# Patient Record
Sex: Male | Born: 1995 | Race: Black or African American | Hispanic: No | Marital: Single | Smoking: Never smoker
Health system: Southern US, Community
[De-identification: ages and names within clinical notes are randomized; demographics above are authoritative.]

---
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
## 2018-10-01 ENCOUNTER — Ambulatory Visit: Payer: Self-pay | Admitting: Medical

## 2019-05-26 ENCOUNTER — Other Ambulatory Visit: Payer: Self-pay

## 2019-05-26 ENCOUNTER — Emergency Department (HOSPITAL_BASED_OUTPATIENT_CLINIC_OR_DEPARTMENT_OTHER): Payer: Worker's Compensation

## 2019-05-26 ENCOUNTER — Encounter (HOSPITAL_BASED_OUTPATIENT_CLINIC_OR_DEPARTMENT_OTHER): Payer: Self-pay

## 2019-05-26 ENCOUNTER — Emergency Department (HOSPITAL_BASED_OUTPATIENT_CLINIC_OR_DEPARTMENT_OTHER)
Admission: EM | Admit: 2019-05-26 | Discharge: 2019-05-27 | Disposition: A | Discharge status: Home / Self Care | Payer: Worker's Compensation | Attending: Emergency Medicine | Admitting: Emergency Medicine

## 2019-05-26 DIAGNOSIS — Y999 Unspecified external cause status: Secondary | ICD-10-CM | POA: Insufficient documentation

## 2019-05-26 DIAGNOSIS — S4991XA Unspecified injury of right shoulder and upper arm, initial encounter: Secondary | ICD-10-CM | POA: Diagnosis present

## 2019-05-26 DIAGNOSIS — W010XXA Fall on same level from slipping, tripping and stumbling without subsequent striking against object, initial encounter: Secondary | ICD-10-CM | POA: Diagnosis not present

## 2019-05-26 DIAGNOSIS — S43011A Anterior subluxation of right humerus, initial encounter: Secondary | ICD-10-CM | POA: Diagnosis not present

## 2019-05-26 DIAGNOSIS — Y939 Activity, unspecified: Secondary | ICD-10-CM | POA: Diagnosis not present

## 2019-05-26 DIAGNOSIS — S43004A Unspecified dislocation of right shoulder joint, initial encounter: Secondary | ICD-10-CM

## 2019-05-26 DIAGNOSIS — Y929 Unspecified place or not applicable: Secondary | ICD-10-CM | POA: Insufficient documentation

## 2019-05-26 MED ORDER — MORPHINE SULFATE (PF) 4 MG/ML IV SOLN
4.0000 mg | Freq: Once | INTRAVENOUS | Status: DC
  Filled 2019-05-26: qty 1

## 2019-05-26 MED ORDER — FENTANYL CITRATE (PF) 100 MCG/2ML IJ SOLN
100.0000 ug | Freq: Once | INTRAMUSCULAR | Status: AC
  Administered 2019-05-26: 100 ug via INTRAVENOUS
  Filled 2019-05-26: qty 2

## 2019-05-26 MED ORDER — METHOCARBAMOL 1000 MG/10ML IJ SOLN
1000.0000 mg | Freq: Once | INTRAMUSCULAR | Status: AC
  Administered 2019-05-26: 1000 mg via INTRAVENOUS
  Filled 2019-05-26: qty 10

## 2019-05-26 MED ORDER — KETOROLAC TROMETHAMINE 30 MG/ML IJ SOLN
15.0000 mg | Freq: Once | INTRAMUSCULAR | Status: AC
  Administered 2019-05-26: 15 mg via INTRAVENOUS
  Filled 2019-05-26: qty 1

## 2019-05-26 MED ORDER — HYDROMORPHONE HCL 1 MG/ML IJ SOLN
1.0000 mg | Freq: Once | INTRAMUSCULAR | Status: AC
  Administered 2019-05-26: 1 mg via INTRAVENOUS
  Filled 2019-05-26: qty 1

## 2019-05-26 NOTE — ED Triage Notes (Signed)
Pt c/o slip/fall ~930pm-pain to right shoulder-pt wearing fabric sling-steady gait/grimacing

## 2019-05-27 ENCOUNTER — Emergency Department (HOSPITAL_BASED_OUTPATIENT_CLINIC_OR_DEPARTMENT_OTHER): Payer: Worker's Compensation

## 2019-05-27 ENCOUNTER — Encounter (HOSPITAL_BASED_OUTPATIENT_CLINIC_OR_DEPARTMENT_OTHER): Payer: Self-pay | Admitting: Emergency Medicine

## 2019-05-27 MED ORDER — DICLOFENAC SODIUM ER 100 MG PO TB24: 100.0000 mg | ORAL_TABLET | Freq: Every day | ORAL | 0 refills | Status: AC

## 2019-05-27 NOTE — ED Provider Notes (Signed)
[facility] EMERGENCY DEPARTMENT Provider Note   CSN: [id number] Arrival date & time: 05/26/19  2151     History   Chief Complaint Chief Complaint  Patient presents with  . Shoulder Injury    HPI John Snyder is a 23 y.o. male.     The history is provided by the patient.  Shoulder Injury This is a new problem. The current episode started 1 to 2 hours ago. The problem occurs constantly. The problem has not changed since onset.Pertinent negatives include no chest pain, no abdominal pain, no headaches and no shortness of breath. Nothing aggravates the symptoms. Nothing relieves the symptoms. He has tried nothing for the symptoms. The treatment provided no relief.  Patient slipped and fell at approximately 930 and hit arm and now right shoulder is deformed.  No LOC.  Did not hit head.  Last meal 3 hours ago.    History reviewed. No pertinent past medical history.  There are no active problems to display for this patient.   History reviewed. No pertinent surgical history.      Home Medications    Prior to Admission medications   Medication Sig Start Date End Date Taking? Authorizing Provider  Diclofenac Sodium CR 100 MG 24 hr tablet Take 1 tablet (100 mg total) by mouth daily. 05/27/19   Anica Alcaraz, MD    Family History No family history on file.  Social History Social History   Tobacco Use  . Smoking status: Never Smoker  . Smokeless tobacco: Never Used  Substance Use Topics  . Alcohol use: Yes    Comment: weekly  . Drug use: Yes    Types: Marijuana     Allergies   Patient has no known allergies.   Review of Systems Review of Systems  Constitutional: Negative for fever.  HENT: Negative for congestion.   Eyes: Negative for visual disturbance.  Respiratory: Negative for cough and shortness of breath.   Cardiovascular: Negative for chest pain.  Gastrointestinal: Negative for abdominal pain.  Genitourinary: Negative for difficulty  urinating.  Musculoskeletal: Positive for arthralgias.  Neurological: Negative for dizziness and headaches.  All other systems reviewed and are negative.    Physical Exam Updated Vital Signs BP 94/67   Pulse 69   Temp 98.5 F (36.9 C) (Oral)   Resp 18   Ht 6\' 2"  (1.88 m)   Wt 90.7 kg   SpO2 100%   BMI 25.68 kg/m   Physical Exam Vitals signs and nursing note reviewed.  Constitutional:      General: He is not in acute distress.    Appearance: He is normal weight.  HENT:     Head: Normocephalic and atraumatic.     Nose: Nose normal.  Eyes:     Conjunctiva/sclera: Conjunctivae normal.     Pupils: Pupils are equal, round, and reactive to light.  Neck:     Musculoskeletal: Normal range of motion and neck supple.  Cardiovascular:     Rate and Rhythm: Normal rate and regular rhythm.     Pulses: Normal pulses.     Heart sounds: Normal heart sounds.  Pulmonary:     Effort: Pulmonary effort is normal.     Breath sounds: Normal breath sounds.  Abdominal:     General: Abdomen is flat. Bowel sounds are normal.     Tenderness: There is no abdominal tenderness.  Musculoskeletal:     Right shoulder: He exhibits decreased range of motion and deformity. He exhibits no effusion.  Right elbow: Normal.    Right wrist: Normal.     Right upper arm: Normal.     Right hand: Normal. He exhibits normal capillary refill. Normal sensation noted. Normal strength noted.  Skin:    General: Skin is warm and dry.     Capillary Refill: Capillary refill takes less than 2 seconds.  Neurological:     General: No focal deficit present.     Mental Status: He is alert and oriented to person, place, and time.  Psychiatric:        Mood and Affect: Mood normal.        Behavior: Behavior normal.      ED Treatments / Results  Labs (all labs ordered are listed, but only abnormal results are displayed) Labs Reviewed - No data to display  EKG None  Radiology Dg Shoulder Right  Result Date:  05/27/2019 CLINICAL DATA:  Status post reduction EXAM: RIGHT SHOULDER - 2+ VIEW COMPARISON:  Film from the previous day. FINDINGS: Humeral head is been reduced into the glenoid. No acute fracture is seen. No soft tissue abnormality is noted. IMPRESSION: Interval reduction of previously seen right shoulder dislocation. Electronically Signed   By: Inez Catalina M.D.   On: 05/27/2019 00:13   Dg Shoulder Right  Result Date: 05/26/2019 CLINICAL DATA:  Pain EXAM: RIGHT SHOULDER - 2+ VIEW COMPARISON:  None. FINDINGS: There is an anterior inferior glenohumeral dislocation without evidence for displaced fracture. The osseous mineralization is within normal limits. IMPRESSION: Anterior inferior glenohumeral dislocation without evidence for displaced fracture. Post reduction radiographs are recommended. Electronically Signed   By: Constance Holster M.D.   On: 05/26/2019 22:28    Procedures .Ortho Injury Treatment  Date/Time: 05/27/2019 3:18 AM Performed by: Veatrice Kells, MD Authorized by: Veatrice Kells, MD   Consent:    Consent obtained:  Verbal   Consent given by:  Patient   Risks discussed:  Fracture, irreducible dislocation, recurrent dislocation, nerve damage, restricted joint movement and stiffness   Alternatives discussed:  No treatment, alternative treatment and delayed treatmentInjury location: shoulder Location details: right shoulder Injury type: dislocation Hill-Sachs deformity: no Chronicity: new Pre-procedure distal perfusion: normal Pre-procedure neurological function: normal Pre-procedure range of motion: reduced  Anesthesia: Local anesthesia used: no  Patient sedated: NoManipulation performed: yes Reduction method: Stimson maneuver (weights ) Reduction successful: yes X-ray confirmed reduction: yes Immobilization: shoulder immobilizer. Post-procedure neurovascular assessment: post-procedure neurovascularly intact Post-procedure distal perfusion: normal Post-procedure  neurological function: normal Post-procedure range of motion: normal Patient tolerance: patient tolerated the procedure well with no immediate complications    (including critical care time)  Medications Ordered in ED Medications  morphine 4 MG/ML injection 4 mg (0 mg Intravenous Hold 05/26/19 2245)  HYDROmorphone (DILAUDID) injection 1 mg (1 mg Intravenous Given 05/26/19 2245)  ketorolac (TORADOL) 30 MG/ML injection 15 mg (15 mg Intravenous Given 05/26/19 2330)  methocarbamol (ROBAXIN) injection 1,000 mg (1,000 mg Intravenous Given 05/26/19 2331)  fentaNYL (SUBLIMAZE) injection 100 mcg (100 mcg Intravenous Given 05/26/19 2337)     Initial Impression / Assessment and Plan / ED Course  Successful reduction of dislocation.  Keep in immobilizer.  Follow up with orthopedics.  Strict return precautions given.   John Snyder was evaluated in Emergency Department on 05/27/2019 for the symptoms described in the history of present illness. He was evaluated in the context of the global COVID-19 pandemic, which necessitated consideration that the patient might be at risk for infection with the SARS-CoV-2 virus that causes COVID-19. Institutional  protocols and algorithms that pertain to the evaluation of patients at risk for COVID-19 are in a state of rapid change based on information released by regulatory bodies including the CDC and federal and state organizations. These policies and algorithms were followed during the patient's care in the ED.   Final Clinical Impressions(s) / ED Diagnoses   Final diagnoses:  Dislocation of right shoulder joint, initial encounter     Return for weakness, numbness, changes in vision or speech, fevers >100.4 unrelieved by medication, shortness of breath, intractable vomiting, or diarrhea, abdominal pain, Inability to tolerate liquids or food, cough, altered mental status or any concerns. No signs of systemic illness or infection. The patient is nontoxic-appearing on  exam and vital signs are within normal limits.   I have reviewed the triage vital signs and the nursing notes. Pertinent labs &imaging results that were available during my care of the patient were reviewed by me and considered in my medical decision making (see chart for details).  After history, exam, and medical workup I feel the patient has been appropriately medically screened and is safe for discharge home. Pertinent diagnoses were discussed with the patient. Patient was given return precautions.    ED Discharge Orders         Ordered    Diclofenac Sodium CR 100 MG 24 hr tablet  Daily     05/27/19 0024           Leonard Hendler, MD 05/27/19 5916

## 2019-05-27 NOTE — ED Notes (Signed)
Pt right shoulder reduction by EDP.  Pt tolerated well and states that he is feeling well.  [place] immobilizer was applied, pt repositioned await follow up post reduction shoulder xray

## 2019-06-09 ENCOUNTER — Other Ambulatory Visit: Payer: Self-pay

## 2019-06-09 ENCOUNTER — Encounter (HOSPITAL_BASED_OUTPATIENT_CLINIC_OR_DEPARTMENT_OTHER): Payer: Self-pay

## 2019-06-09 ENCOUNTER — Emergency Department (HOSPITAL_BASED_OUTPATIENT_CLINIC_OR_DEPARTMENT_OTHER): Payer: Worker's Compensation

## 2019-06-09 ENCOUNTER — Emergency Department (HOSPITAL_BASED_OUTPATIENT_CLINIC_OR_DEPARTMENT_OTHER)
Admission: EM | Admit: 2019-06-09 | Discharge: 2019-06-09 | Disposition: A | Discharge status: Home / Self Care | Payer: Worker's Compensation | Attending: Emergency Medicine | Admitting: Emergency Medicine

## 2019-06-09 DIAGNOSIS — Y998 Other external cause status: Secondary | ICD-10-CM | POA: Insufficient documentation

## 2019-06-09 DIAGNOSIS — Y9384 Activity, sleeping: Secondary | ICD-10-CM | POA: Insufficient documentation

## 2019-06-09 DIAGNOSIS — S43014A Anterior dislocation of right humerus, initial encounter: Secondary | ICD-10-CM | POA: Diagnosis present

## 2019-06-09 DIAGNOSIS — X500XXA Overexertion from strenuous movement or load, initial encounter: Secondary | ICD-10-CM | POA: Diagnosis not present

## 2019-06-09 DIAGNOSIS — Y92003 Bedroom of unspecified non-institutional (private) residence as the place of occurrence of the external cause: Secondary | ICD-10-CM | POA: Diagnosis not present

## 2019-06-09 MED ORDER — ONDANSETRON HCL 4 MG/2ML IJ SOLN
4.0000 mg | Freq: Once | INTRAMUSCULAR | Status: AC
  Administered 2019-06-09: 4 mg via INTRAVENOUS
  Filled 2019-06-09: qty 2

## 2019-06-09 MED ORDER — MORPHINE SULFATE (PF) 4 MG/ML IV SOLN
4.0000 mg | Freq: Once | INTRAVENOUS | Status: AC
  Administered 2019-06-09: 4 mg via INTRAVENOUS
  Filled 2019-06-09: qty 1

## 2019-06-09 MED ORDER — LIDOCAINE HCL 2 % IJ SOLN
20.0000 mL | Freq: Once | INTRAMUSCULAR | Status: AC
  Administered 2019-06-09: 05:00:00 400 mg
  Filled 2019-06-09: qty 20

## 2019-06-09 NOTE — ED Notes (Signed)
ED Provider at bedside. 

## 2019-06-09 NOTE — ED Triage Notes (Addendum)
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

## 2019-06-09 NOTE — Discharge Instructions (Addendum)
You were seen today for recurrent shoulder dislocation.  Keep in a sling with minimal range of motion.  Avoid sleeping with your arm over your head.  Follow-up with orthopedics.

## 2019-06-09 NOTE — ED Notes (Addendum)
Xray at bedside post reduction

## 2019-06-09 NOTE — ED Provider Notes (Signed)
[facility] EMERGENCY DEPARTMENT Provider Note   CSN: [id number] Arrival date & time: 06/09/19  0422     History   Chief Complaint Chief Complaint  Patient presents with  . Shoulder Pain    HPI John Snyder is a 23 y.o. male.     HPI  This is a 23 year old male who presents with right shoulder dislocation.  Recent history of the same on 05/27/2019.  Patient reports that he was sleeping.  He normally sleeps with his arms above his head.  He tried to avoid this but woke up with right shoulder pain.  Reports some tingling in his fingers.  Denies any weakness.  Rates his pain at 10 out of 10.  Did not take anything prior to arrival.  Chart reviewed.  Patient with reduction of right shoulder dislocation on November 10.  History reviewed. No pertinent past medical history.  There are no active problems to display for this patient.   History reviewed. No pertinent surgical history.      Home Medications    Prior to Admission medications   Medication Sig Start Date End Date Taking? Authorizing Provider  Diclofenac Sodium CR 100 MG 24 hr tablet Take 1 tablet (100 mg total) by mouth daily. 05/27/19   Palumbo, April, MD    Family History No family history on file.  Social History Social History   Tobacco Use  . Smoking status: Never Smoker  . Smokeless tobacco: Never Used  Substance Use Topics  . Alcohol use: Yes    Comment: weekly  . Drug use: Yes    Types: Marijuana     Allergies   Patient has no known allergies.   Review of Systems Review of Systems  Musculoskeletal:       Right shoulder pain  Neurological:       Finger tingling  All other systems reviewed and are negative.    Physical Exam Updated Vital Signs BP 130/64 (BP Location: Right Arm)   Pulse 70   Temp 97.6 F (36.4 C) (Oral)   Resp 20   Ht 1.88 m (6\' 2" )   Wt 90.7 kg   SpO2 100%   BMI 25.68 kg/m   Physical Exam Vitals signs and nursing note reviewed.   Constitutional:      Appearance: He is well-developed. He is not ill-appearing.  HENT:     Head: Normocephalic and atraumatic.  Eyes:     Pupils: Pupils are equal, round, and reactive to light.  Cardiovascular:     Rate and Rhythm: Normal rate and regular rhythm.  Pulmonary:     Effort: Pulmonary effort is normal. No respiratory distress.  Abdominal:     Palpations: Abdomen is soft.     Tenderness: There is no abdominal tenderness.  Musculoskeletal:     Comments: Asymmetry noted of the right shoulder with an empty glenoid fossa and anterior dislocation of the humeral head, 2+ radial pulse distally, neurovascularly intact  Skin:    General: Skin is warm and dry.     Capillary Refill: Capillary refill takes less than 2 seconds.  Neurological:     Mental Status: He is alert and oriented to person, place, and time.  Psychiatric:        Mood and Affect: Mood normal.      ED Treatments / Results  Labs (all labs ordered are listed, but only abnormal results are displayed) Labs Reviewed - No data to display  EKG None  Radiology Dg Shoulder Right  Result Date: 06/09/2019 CLINICAL DATA:  Dislocation, recurrent left shoulder dislocation, a low can with left shoulder pain EXAM: RIGHT SHOULDER - 2+ VIEW COMPARISON:  Radiograph 05/27/2019 FINDINGS: There is a Hill-Sachs deformity of the posterolateral humeral head. No acute fracture or traumatic malalignment is seen. Humeral head is normally seated. Acromioclavicular and coracoclavicular intervals are maintained. Mild soft tissue swelling of the shoulder is noted. IMPRESSION: Hill-Sachs deformity of the posterolateral humeral head, similar to prior. No acute osseous abnormality. No acute dislocation. Electronically Signed   By: Lovena Le M.D.   On: 06/09/2019 05:25    Procedures Reduction of dislocation  Date/Time: 06/09/2019 5:30 AM Performed by: Merryl Hacker, MD Authorized by: Merryl Hacker, MD  Consent: Verbal  consent obtained. Consent given by: patient Patient understanding: patient states understanding of the procedure being performed Patient identity confirmed: verbally with patient Time out: Immediately prior to procedure a "time out" was called to verify the correct patient, procedure, equipment, support staff and site/side marked as required. Local anesthesia used: yes Anesthesia: local infiltration  Anesthesia: Local anesthesia used: yes Local Anesthetic: lidocaine 2% without epinephrine Anesthetic total: 20 mL  Sedation: Patient sedated: no  Patient tolerance: patient tolerated the procedure well with no immediate complications Comments: Patient was given pain medication.  Joint was injected with 2% lidocaine.  Joint was relocated with axial traction and external rotation.  Postreduction films reviewed.  Persistent Hill-Sachs deformity of the humeral head as noted on x-ray from prior visit.    (including critical care time)  Medications Ordered in ED Medications  morphine 4 MG/ML injection 4 mg (4 mg Intravenous Given 06/09/19 0447)  ondansetron (ZOFRAN) injection 4 mg (4 mg Intravenous Given 06/09/19 0447)  lidocaine (XYLOCAINE) 2 % (with pres) injection 400 mg (400 mg Infiltration Given 06/09/19 0447)     Initial Impression / Assessment and Plan / ED Course  I have reviewed the triage vital signs and the nursing notes.  Pertinent labs & imaging results that were available during my care of the patient were reviewed by me and considered in my medical decision making (see chart for details).        Patient presents with an obvious right anterior shoulder dislocation.  Given obvious nature of injury on exam, initial x-rays deferred.  Shoulder was reduced without difficulty.  Discussed with the patient and his father that he likely has a very lax shoulder joint and needs to follow-up with orthopedics given recurrent dislocation.  He has a persistent Hill-Sachs deformity which  was present on his prior x-rays.  Patient was placed in a sling and given orthopedics follow-up.  After history, exam, and medical workup I feel the patient has been appropriately medically screened and is safe for discharge home. Pertinent diagnoses were discussed with the patient. Patient was given return precautions.  Final Clinical Impressions(s) / ED Diagnoses   Final diagnoses:  Anterior dislocation of right shoulder, initial encounter    ED Discharge Orders    None       Horton, Barbette Hair, MD 06/09/19 [phone]

## 2021-08-10 IMAGING — DX DG SHOULDER 2+V*R*
1 series · 1 of 1 positions shown · non-contrast
Comparison: Radiograph 05/27/2019

CLINICAL DATA: Dislocation, recurrent left shoulder dislocation, a
low can with left shoulder pain

EXAM:
RIGHT SHOULDER - 2+ VIEW

[shoulder axial]
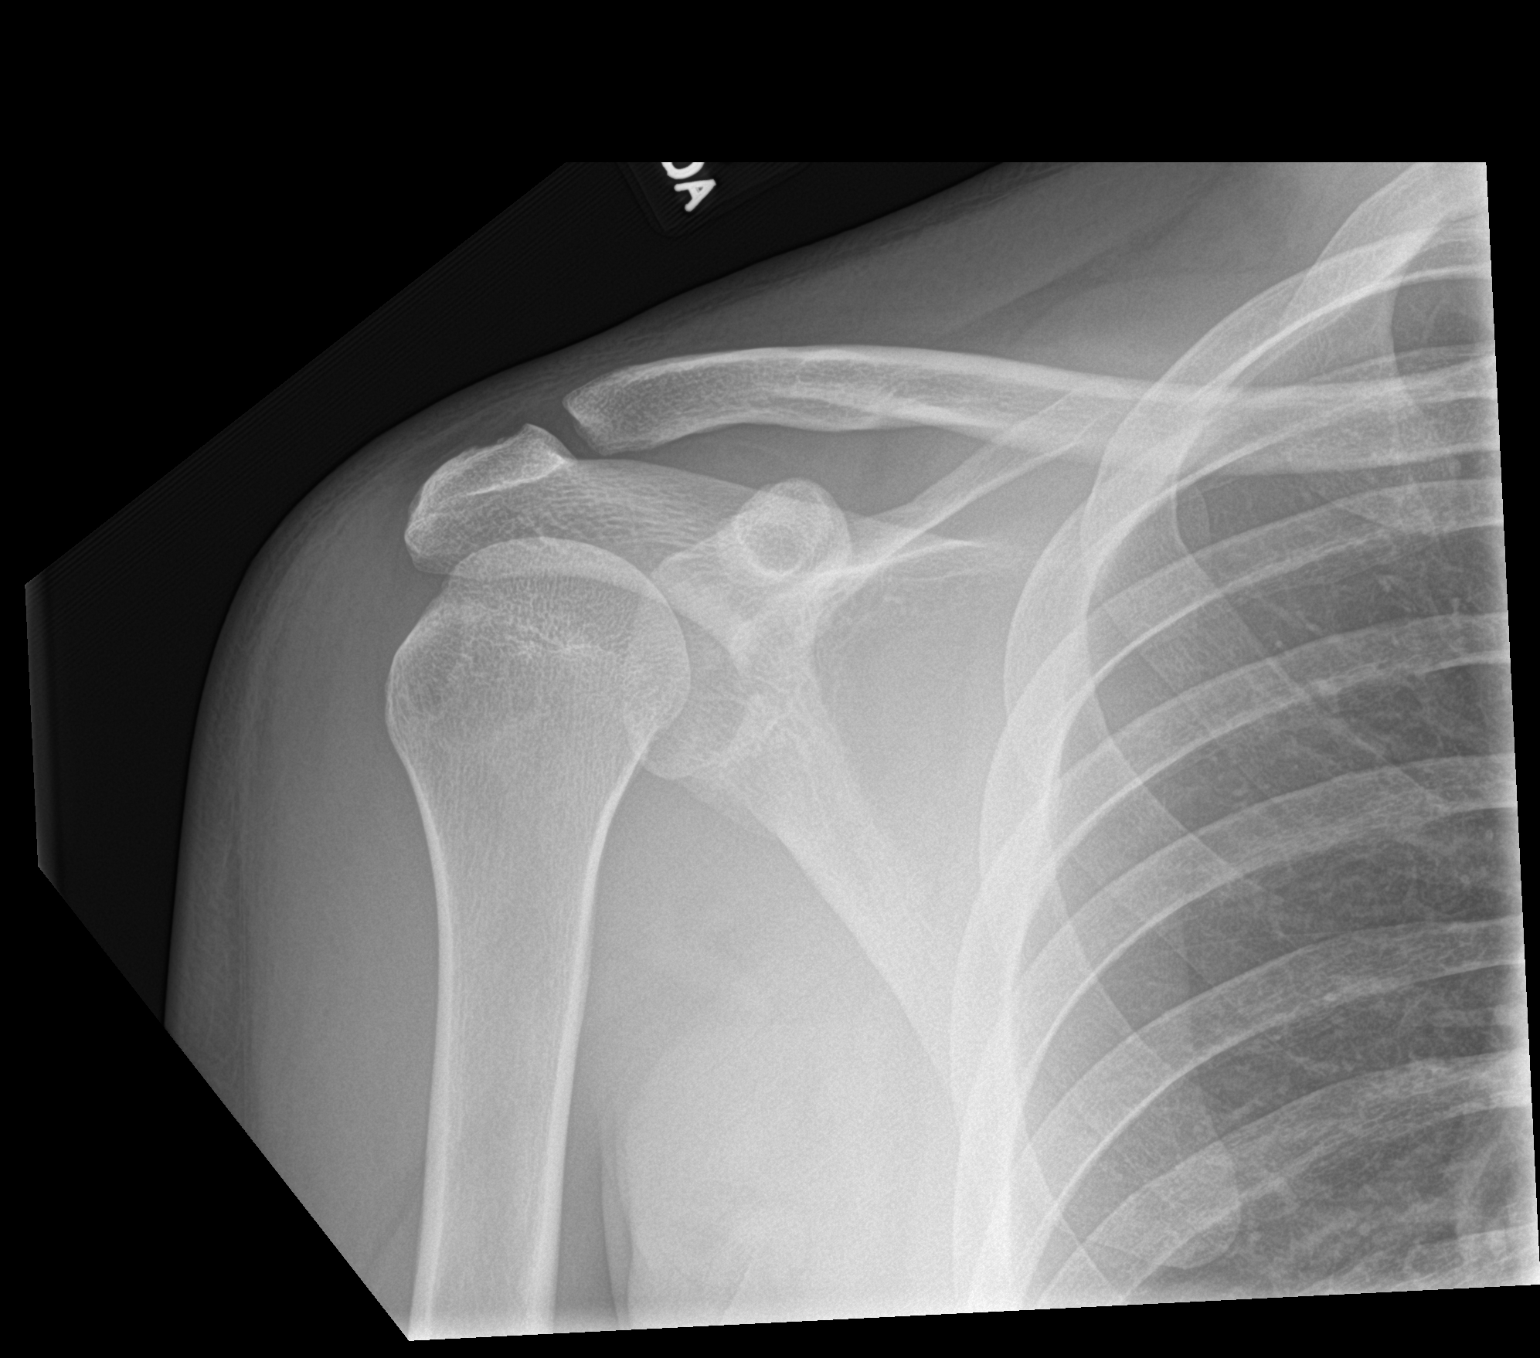

[1 of 1 positions shown; findings below may reference images not displayed]

FINDINGS: There is a Hill-Sachs deformity of the posterolateral humeral head.
No acute fracture or traumatic malalignment is seen. Humeral head is
normally seated. Acromioclavicular and coracoclavicular intervals
are maintained. Mild soft tissue swelling of the shoulder is noted.
IMPRESSION: Hill-Sachs deformity of the posterolateral humeral head, similar to
prior. No acute osseous abnormality. No acute dislocation.
